# Patient Record
Sex: Female | Born: 1960 | Race: White | Hispanic: No | Marital: Married
Health system: Southern US, Community
[De-identification: ages and names within clinical notes are randomized; demographics above are authoritative.]

---

## 2007-11-04 ENCOUNTER — Encounter: Admission: RE | Admit: 2007-11-04 | Discharge: 2007-11-04 | Payer: Self-pay | Admitting: Internal Medicine

## 2009-04-05 ENCOUNTER — Ambulatory Visit (HOSPITAL_COMMUNITY): Admission: RE | Admit: 2009-04-05 | Discharge: 2009-04-05 | Payer: Self-pay | Admitting: Internal Medicine

## 2010-09-20 ENCOUNTER — Other Ambulatory Visit (HOSPITAL_COMMUNITY): Payer: Self-pay | Admitting: Family Medicine

## 2010-09-20 DIAGNOSIS — Z139 Encounter for screening, unspecified: Secondary | ICD-10-CM

## 2010-09-27 ENCOUNTER — Ambulatory Visit (HOSPITAL_COMMUNITY)
Admission: RE | Admit: 2010-09-27 | Discharge: 2010-09-27 | Disposition: A | Discharge status: Home / Self Care | Payer: 59 | Source: Ambulatory Visit | Attending: Family Medicine | Admitting: Family Medicine

## 2010-09-27 DIAGNOSIS — Z1231 Encounter for screening mammogram for malignant neoplasm of breast: Secondary | ICD-10-CM | POA: Insufficient documentation

## 2010-09-27 DIAGNOSIS — Z139 Encounter for screening, unspecified: Secondary | ICD-10-CM

## 2011-09-19 ENCOUNTER — Other Ambulatory Visit (HOSPITAL_COMMUNITY): Payer: Self-pay | Admitting: Family Medicine

## 2011-09-19 DIAGNOSIS — Z139 Encounter for screening, unspecified: Secondary | ICD-10-CM

## 2011-10-09 ENCOUNTER — Ambulatory Visit (HOSPITAL_COMMUNITY)
Admission: RE | Admit: 2011-10-09 | Discharge: 2011-10-09 | Disposition: A | Discharge status: Home / Self Care | Payer: 59 | Source: Ambulatory Visit | Attending: Family Medicine | Admitting: Family Medicine

## 2011-10-09 DIAGNOSIS — Z139 Encounter for screening, unspecified: Secondary | ICD-10-CM

## 2011-10-09 DIAGNOSIS — Z1231 Encounter for screening mammogram for malignant neoplasm of breast: Secondary | ICD-10-CM | POA: Insufficient documentation

## 2012-09-13 ENCOUNTER — Other Ambulatory Visit (HOSPITAL_COMMUNITY): Payer: Self-pay | Admitting: Family Medicine

## 2012-09-13 DIAGNOSIS — Z139 Encounter for screening, unspecified: Secondary | ICD-10-CM

## 2012-10-14 ENCOUNTER — Ambulatory Visit (HOSPITAL_COMMUNITY)
Admission: RE | Admit: 2012-10-14 | Discharge: 2012-10-14 | Disposition: A | Discharge status: Home / Self Care | Payer: 59 | Source: Ambulatory Visit | Attending: Family Medicine | Admitting: Family Medicine

## 2012-10-14 DIAGNOSIS — Z1231 Encounter for screening mammogram for malignant neoplasm of breast: Secondary | ICD-10-CM | POA: Insufficient documentation

## 2012-10-14 DIAGNOSIS — Z139 Encounter for screening, unspecified: Secondary | ICD-10-CM

## 2012-10-28 ENCOUNTER — Other Ambulatory Visit: Payer: Self-pay | Admitting: Family Medicine

## 2012-10-28 DIAGNOSIS — R928 Other abnormal and inconclusive findings on diagnostic imaging of breast: Secondary | ICD-10-CM

## 2012-11-06 ENCOUNTER — Other Ambulatory Visit: Payer: Self-pay | Admitting: Family Medicine

## 2012-11-06 ENCOUNTER — Ambulatory Visit (HOSPITAL_COMMUNITY)
Admission: RE | Admit: 2012-11-06 | Discharge: 2012-11-06 | Disposition: A | Discharge status: Home / Self Care | Payer: 59 | Source: Ambulatory Visit | Attending: Family Medicine | Admitting: Family Medicine

## 2012-11-06 DIAGNOSIS — R928 Other abnormal and inconclusive findings on diagnostic imaging of breast: Secondary | ICD-10-CM

## 2013-04-15 ENCOUNTER — Other Ambulatory Visit (HOSPITAL_COMMUNITY): Payer: Self-pay | Admitting: Family Medicine

## 2013-04-15 DIAGNOSIS — Z09 Encounter for follow-up examination after completed treatment for conditions other than malignant neoplasm: Secondary | ICD-10-CM

## 2013-05-14 ENCOUNTER — Ambulatory Visit (HOSPITAL_COMMUNITY): Payer: 59

## 2013-05-21 ENCOUNTER — Other Ambulatory Visit (HOSPITAL_COMMUNITY): Payer: Self-pay | Admitting: Family Medicine

## 2013-05-21 ENCOUNTER — Ambulatory Visit (HOSPITAL_COMMUNITY)
Admission: RE | Admit: 2013-05-21 | Discharge: 2013-05-21 | Disposition: A | Discharge status: Home / Self Care | Payer: 59 | Source: Ambulatory Visit | Attending: Family Medicine | Admitting: Family Medicine

## 2013-05-21 DIAGNOSIS — Z09 Encounter for follow-up examination after completed treatment for conditions other than malignant neoplasm: Secondary | ICD-10-CM

## 2013-05-21 DIAGNOSIS — N63 Unspecified lump in unspecified breast: Secondary | ICD-10-CM | POA: Insufficient documentation

## 2013-10-13 ENCOUNTER — Other Ambulatory Visit (HOSPITAL_COMMUNITY): Payer: Self-pay | Admitting: Family Medicine

## 2013-10-13 DIAGNOSIS — R928 Other abnormal and inconclusive findings on diagnostic imaging of breast: Secondary | ICD-10-CM

## 2013-11-04 ENCOUNTER — Ambulatory Visit (HOSPITAL_COMMUNITY)
Admission: RE | Admit: 2013-11-04 | Discharge: 2013-11-04 | Disposition: A | Discharge status: Home / Self Care | Payer: 59 | Source: Ambulatory Visit | Attending: Family Medicine | Admitting: Family Medicine

## 2013-11-04 DIAGNOSIS — R928 Other abnormal and inconclusive findings on diagnostic imaging of breast: Secondary | ICD-10-CM | POA: Insufficient documentation

## 2014-10-12 ENCOUNTER — Other Ambulatory Visit: Payer: Self-pay

## 2014-10-12 DIAGNOSIS — Z1231 Encounter for screening mammogram for malignant neoplasm of breast: Secondary | ICD-10-CM

## 2014-11-09 ENCOUNTER — Ambulatory Visit (HOSPITAL_COMMUNITY)
Admission: RE | Admit: 2014-11-09 | Discharge: 2014-11-09 | Disposition: A | Discharge status: Home / Self Care | Payer: 59 | Source: Ambulatory Visit | Attending: Family Medicine | Admitting: Family Medicine

## 2014-11-09 ENCOUNTER — Other Ambulatory Visit (HOSPITAL_COMMUNITY): Payer: Self-pay | Admitting: Family Medicine

## 2014-11-09 DIAGNOSIS — Z1231 Encounter for screening mammogram for malignant neoplasm of breast: Secondary | ICD-10-CM | POA: Insufficient documentation

## 2014-11-10 ENCOUNTER — Ambulatory Visit (HOSPITAL_COMMUNITY): Payer: Self-pay

## 2015-10-26 ENCOUNTER — Other Ambulatory Visit (HOSPITAL_COMMUNITY): Payer: Self-pay | Admitting: Family Medicine

## 2015-10-26 DIAGNOSIS — Z1231 Encounter for screening mammogram for malignant neoplasm of breast: Secondary | ICD-10-CM

## 2015-11-10 ENCOUNTER — Encounter (HOSPITAL_COMMUNITY): Payer: Self-pay | Admitting: Radiology

## 2015-11-10 ENCOUNTER — Ambulatory Visit (HOSPITAL_COMMUNITY)
Admission: RE | Admit: 2015-11-10 | Discharge: 2015-11-10 | Disposition: A | Discharge status: Home / Self Care | Payer: 59 | Source: Ambulatory Visit | Attending: Family Medicine | Admitting: Family Medicine

## 2015-11-10 DIAGNOSIS — Z1231 Encounter for screening mammogram for malignant neoplasm of breast: Secondary | ICD-10-CM | POA: Diagnosis not present

## 2016-11-09 ENCOUNTER — Other Ambulatory Visit (HOSPITAL_COMMUNITY): Payer: Self-pay | Admitting: Family Medicine

## 2016-11-09 DIAGNOSIS — E782 Mixed hyperlipidemia: Secondary | ICD-10-CM | POA: Diagnosis not present

## 2016-11-09 DIAGNOSIS — Z1231 Encounter for screening mammogram for malignant neoplasm of breast: Secondary | ICD-10-CM

## 2016-11-09 DIAGNOSIS — Z6823 Body mass index (BMI) 23.0-23.9, adult: Secondary | ICD-10-CM | POA: Diagnosis not present

## 2016-11-13 ENCOUNTER — Other Ambulatory Visit (HOSPITAL_COMMUNITY): Payer: Self-pay | Admitting: Family Medicine

## 2016-11-13 ENCOUNTER — Ambulatory Visit (HOSPITAL_COMMUNITY)
Admission: RE | Admit: 2016-11-13 | Discharge: 2016-11-13 | Disposition: A | Discharge status: Home / Self Care | Payer: 59 | Source: Ambulatory Visit | Attending: Family Medicine | Admitting: Family Medicine

## 2016-11-13 DIAGNOSIS — Z1231 Encounter for screening mammogram for malignant neoplasm of breast: Secondary | ICD-10-CM

## 2017-05-15 DIAGNOSIS — M79674 Pain in right toe(s): Secondary | ICD-10-CM | POA: Diagnosis not present

## 2017-05-15 DIAGNOSIS — L600 Ingrowing nail: Secondary | ICD-10-CM | POA: Diagnosis not present

## 2017-08-02 DIAGNOSIS — H43812 Vitreous degeneration, left eye: Secondary | ICD-10-CM | POA: Diagnosis not present

## 2017-09-13 DIAGNOSIS — E782 Mixed hyperlipidemia: Secondary | ICD-10-CM | POA: Diagnosis not present

## 2017-09-13 DIAGNOSIS — Z6824 Body mass index (BMI) 24.0-24.9, adult: Secondary | ICD-10-CM | POA: Diagnosis not present

## 2017-09-13 DIAGNOSIS — N951 Menopausal and female climacteric states: Secondary | ICD-10-CM | POA: Diagnosis not present

## 2017-10-08 ENCOUNTER — Other Ambulatory Visit (HOSPITAL_COMMUNITY): Payer: Self-pay | Admitting: Family Medicine

## 2017-10-08 DIAGNOSIS — Z1231 Encounter for screening mammogram for malignant neoplasm of breast: Secondary | ICD-10-CM

## 2017-11-14 ENCOUNTER — Ambulatory Visit (HOSPITAL_COMMUNITY)
Admission: RE | Admit: 2017-11-14 | Discharge: 2017-11-14 | Disposition: A | Discharge status: Home / Self Care | Payer: 59 | Source: Ambulatory Visit | Attending: Family Medicine | Admitting: Family Medicine

## 2017-11-14 ENCOUNTER — Ambulatory Visit (HOSPITAL_COMMUNITY): Payer: 59

## 2017-11-14 DIAGNOSIS — Z1231 Encounter for screening mammogram for malignant neoplasm of breast: Secondary | ICD-10-CM | POA: Insufficient documentation

## 2018-07-09 DIAGNOSIS — L609 Nail disorder, unspecified: Secondary | ICD-10-CM | POA: Diagnosis not present

## 2018-08-23 IMAGING — MG DIGITAL SCREENING BILATERAL MAMMOGRAM WITH TOMO AND CAD
8 series · 8 of 24 positions shown · non-contrast
Comparison: Previous exam(s).

CLINICAL DATA: Screening.

EXAM:
DIGITAL SCREENING BILATERAL MAMMOGRAM WITH TOMO AND CAD

[L CC tomo · tomo slice 33/65.0]
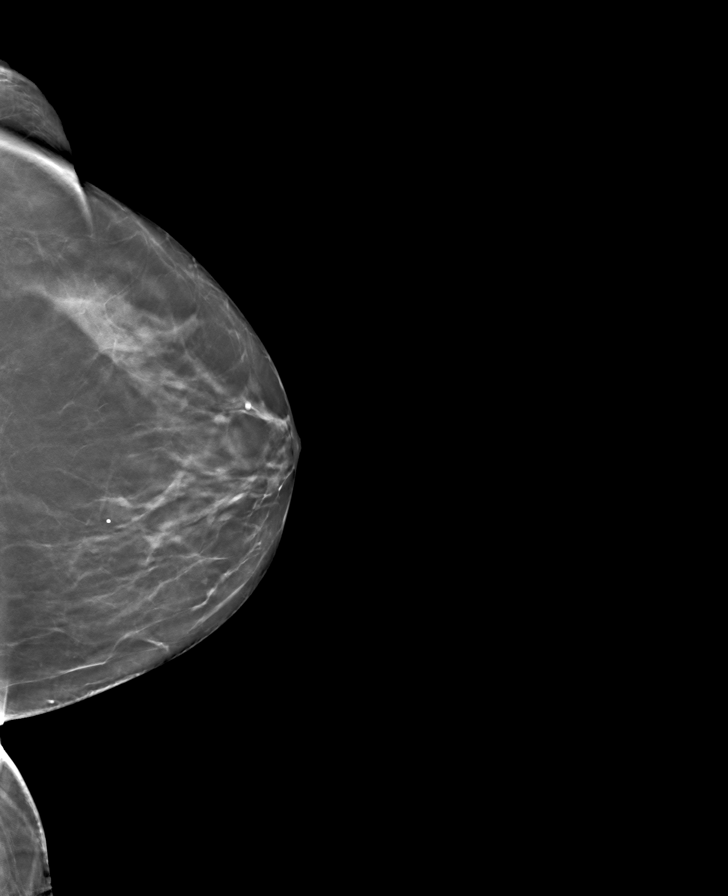

[R CC tomo · tomo slice 33/64.0]
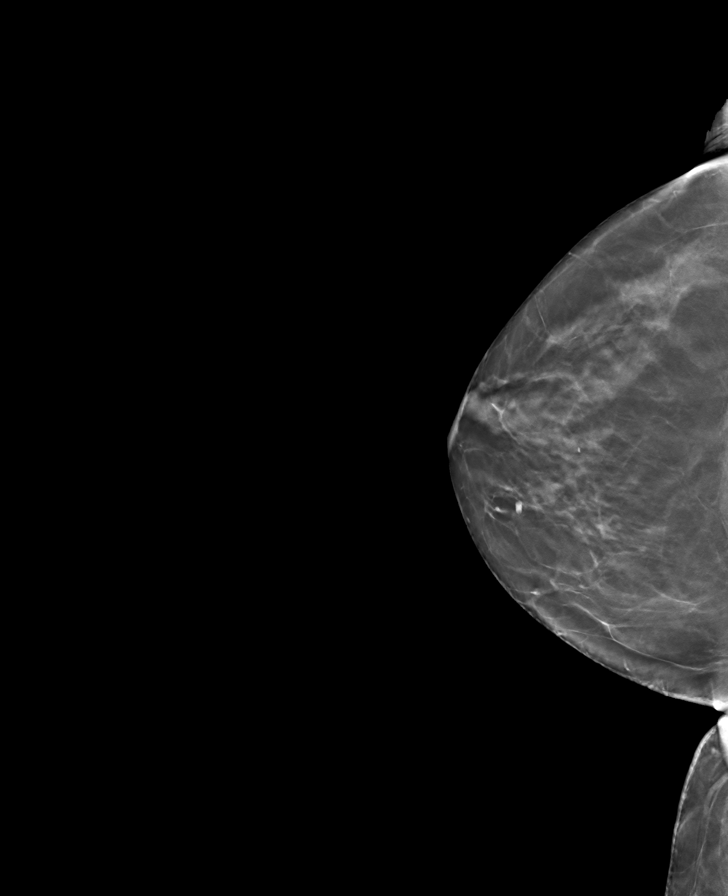

[R MLO tomo · tomo slice 31/60.0]
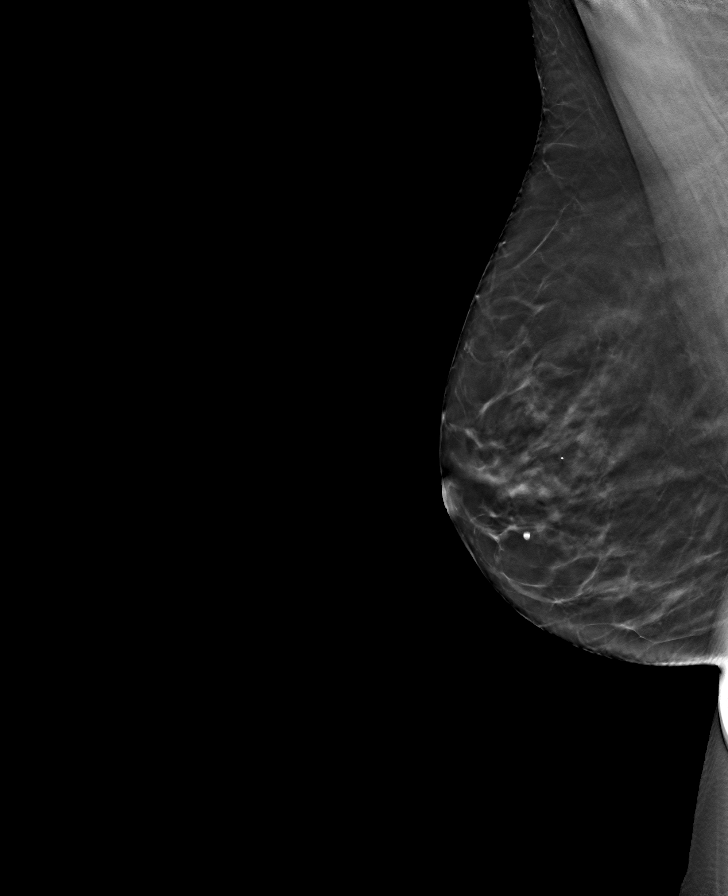

[L MLO tomo · tomo slice 35/68.0]
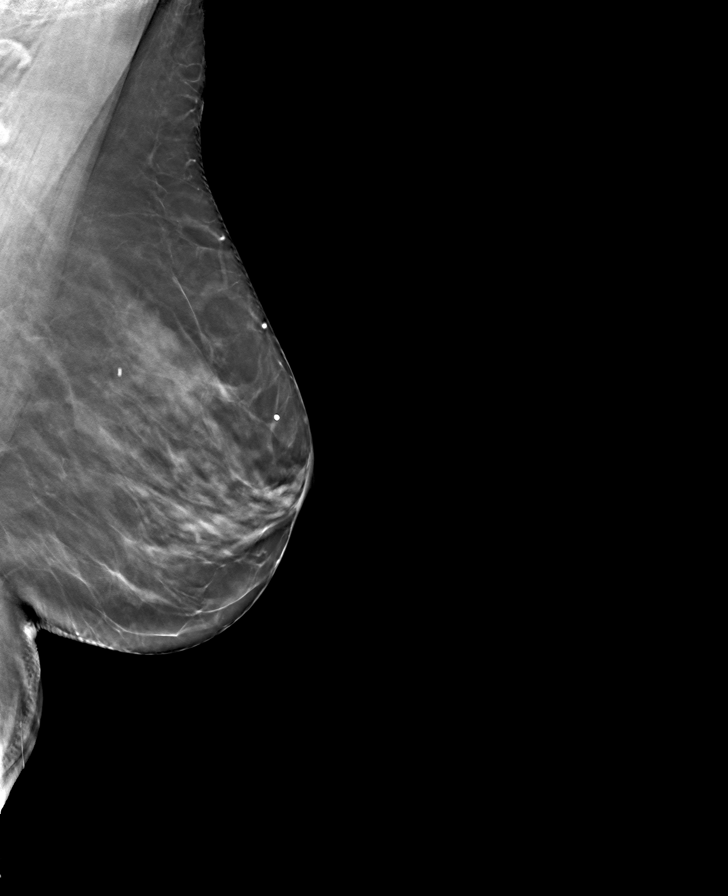

[R CC synth-2D]
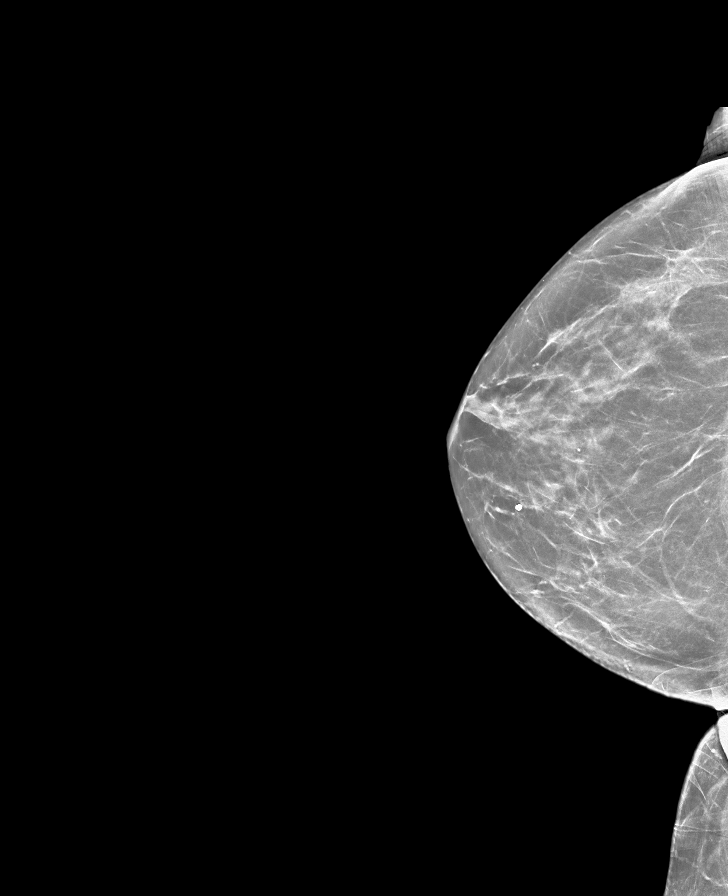

[L CC synth-2D]
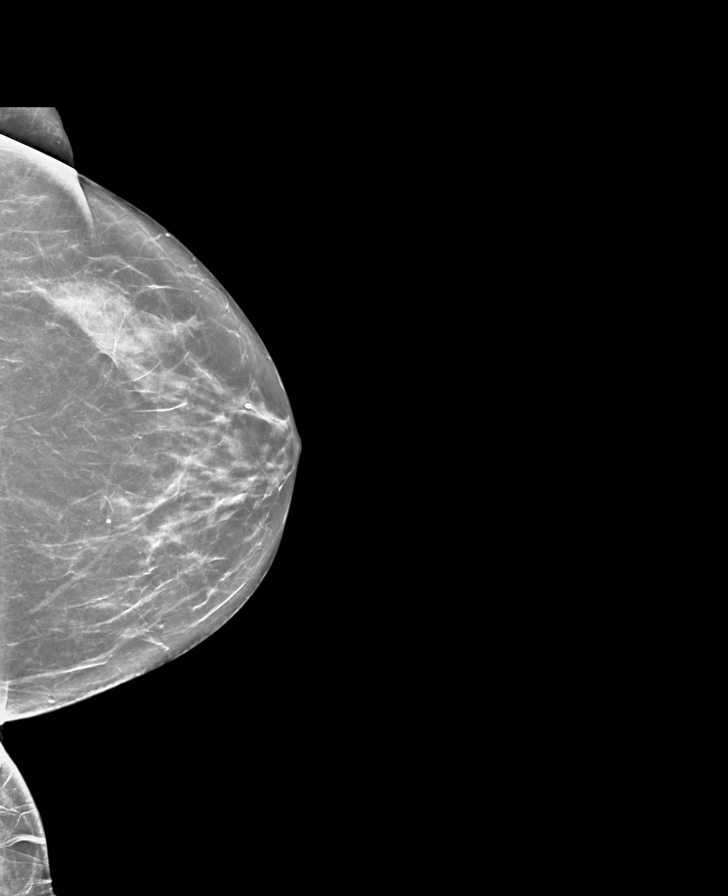

[R MLO synth-2D]
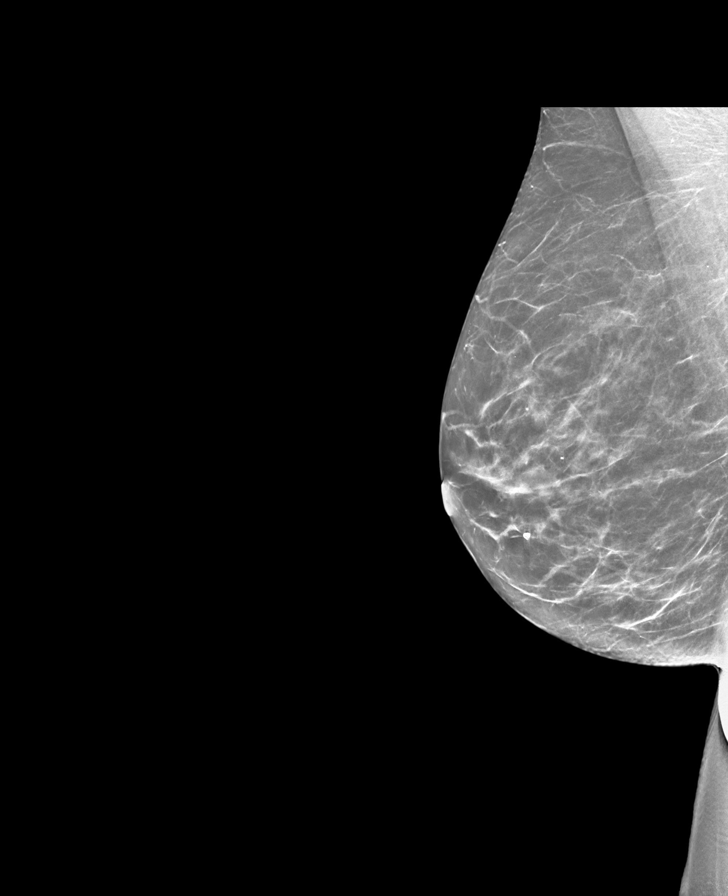

[L MLO synth-2D]
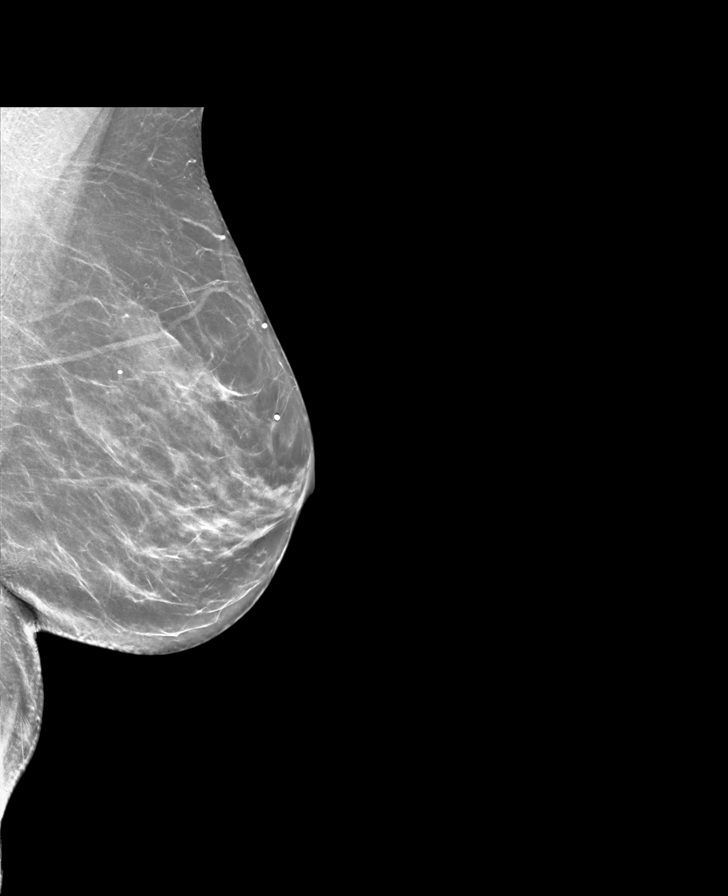

[8 of 24 positions shown; findings below may reference images not displayed]

ACR Breast Density Category b: There are scattered areas of
fibroglandular density.
FINDINGS: There are no findings suspicious for malignancy. Images were
processed with CAD.
IMPRESSION: No mammographic evidence of malignancy. A result letter of this
screening mammogram will be mailed directly to the patient.

RECOMMENDATION:
Screening mammogram in one year. (Code:[id number])

BI-RADS CATEGORY  1: Negative.

## 2021-05-18 ENCOUNTER — Other Ambulatory Visit (HOSPITAL_COMMUNITY): Payer: Self-pay | Admitting: Family Medicine

## 2021-05-18 DIAGNOSIS — Z1231 Encounter for screening mammogram for malignant neoplasm of breast: Secondary | ICD-10-CM

## 2021-05-20 ENCOUNTER — Other Ambulatory Visit: Payer: Self-pay

## 2021-05-20 ENCOUNTER — Ambulatory Visit (HOSPITAL_COMMUNITY)
Admission: RE | Admit: 2021-05-20 | Discharge: 2021-05-20 | Disposition: A | Discharge status: Home / Self Care | Payer: 59 | Source: Ambulatory Visit | Attending: Family Medicine | Admitting: Family Medicine

## 2021-05-20 DIAGNOSIS — Z1231 Encounter for screening mammogram for malignant neoplasm of breast: Secondary | ICD-10-CM | POA: Diagnosis not present

## 2022-08-07 ENCOUNTER — Other Ambulatory Visit (HOSPITAL_COMMUNITY): Payer: Self-pay | Admitting: Family Medicine

## 2022-08-07 DIAGNOSIS — Z1231 Encounter for screening mammogram for malignant neoplasm of breast: Secondary | ICD-10-CM

## 2022-08-11 ENCOUNTER — Ambulatory Visit (HOSPITAL_COMMUNITY): Payer: 59

## 2022-08-11 ENCOUNTER — Encounter (HOSPITAL_COMMUNITY): Payer: Self-pay

## 2022-08-11 ENCOUNTER — Ambulatory Visit (HOSPITAL_COMMUNITY)
Admission: RE | Admit: 2022-08-11 | Discharge: 2022-08-11 | Disposition: A | Discharge status: Home / Self Care | Payer: 59 | Source: Ambulatory Visit | Attending: Family Medicine | Admitting: Family Medicine

## 2022-08-11 DIAGNOSIS — Z1231 Encounter for screening mammogram for malignant neoplasm of breast: Secondary | ICD-10-CM | POA: Insufficient documentation

## 2023-11-05 ENCOUNTER — Other Ambulatory Visit (HOSPITAL_COMMUNITY): Payer: Self-pay | Admitting: Family Medicine

## 2023-11-05 DIAGNOSIS — Z1231 Encounter for screening mammogram for malignant neoplasm of breast: Secondary | ICD-10-CM

## 2023-11-14 ENCOUNTER — Ambulatory Visit (HOSPITAL_COMMUNITY)

## 2023-11-21 ENCOUNTER — Ambulatory Visit (HOSPITAL_COMMUNITY)
Admission: RE | Admit: 2023-11-21 | Discharge: 2023-11-21 | Disposition: A | Discharge status: Home / Self Care | Source: Ambulatory Visit | Attending: Family Medicine | Admitting: Family Medicine

## 2023-11-21 ENCOUNTER — Encounter (HOSPITAL_COMMUNITY): Payer: Self-pay

## 2023-11-21 DIAGNOSIS — Z1231 Encounter for screening mammogram for malignant neoplasm of breast: Secondary | ICD-10-CM | POA: Diagnosis present
